# Patient Record
Sex: Female | Born: 1970 | Race: White | Hispanic: No | Marital: Married | Smoking: Never smoker
Health system: Southern US, Community
[De-identification: ages and names within clinical notes are randomized; demographics above are authoritative.]

## PROBLEM LIST (undated history)

## (undated) DIAGNOSIS — G4730 Sleep apnea, unspecified: Secondary | ICD-10-CM

## (undated) DIAGNOSIS — G2581 Restless legs syndrome: Secondary | ICD-10-CM

## (undated) DIAGNOSIS — R112 Nausea with vomiting, unspecified: Secondary | ICD-10-CM

## (undated) DIAGNOSIS — D649 Anemia, unspecified: Secondary | ICD-10-CM

## (undated) DIAGNOSIS — K219 Gastro-esophageal reflux disease without esophagitis: Secondary | ICD-10-CM

## (undated) DIAGNOSIS — T8859XA Other complications of anesthesia, initial encounter: Secondary | ICD-10-CM

## (undated) DIAGNOSIS — Z87442 Personal history of urinary calculi: Secondary | ICD-10-CM

## (undated) DIAGNOSIS — F419 Anxiety disorder, unspecified: Secondary | ICD-10-CM

## (undated) DIAGNOSIS — Z9889 Other specified postprocedural states: Secondary | ICD-10-CM

## (undated) DIAGNOSIS — G43909 Migraine, unspecified, not intractable, without status migrainosus: Secondary | ICD-10-CM

## (undated) DIAGNOSIS — T4145XA Adverse effect of unspecified anesthetic, initial encounter: Secondary | ICD-10-CM

## (undated) DIAGNOSIS — M549 Dorsalgia, unspecified: Secondary | ICD-10-CM

## (undated) HISTORY — DX: Migraine, unspecified, not intractable, without status migrainosus: G43.909

## (undated) HISTORY — DX: Dorsalgia, unspecified: M54.9

## (undated) HISTORY — DX: Restless legs syndrome: G25.81

---
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
## 1998-08-17 ENCOUNTER — Inpatient Hospital Stay (HOSPITAL_COMMUNITY): Admission: AD | Admit: 1998-08-17 | Discharge: 1998-08-17 | Payer: Self-pay | Admitting: Obstetrics and Gynecology

## 1998-11-17 ENCOUNTER — Inpatient Hospital Stay (HOSPITAL_COMMUNITY): Admission: AD | Admit: 1998-11-17 | Discharge: 1998-11-17 | Payer: Self-pay | Admitting: Obstetrics & Gynecology

## 1999-02-05 ENCOUNTER — Inpatient Hospital Stay (HOSPITAL_COMMUNITY): Admission: AD | Admit: 1999-02-05 | Discharge: 1999-02-05 | Payer: Self-pay | Admitting: Obstetrics and Gynecology

## 1999-02-20 ENCOUNTER — Inpatient Hospital Stay (HOSPITAL_COMMUNITY): Admission: AD | Admit: 1999-02-20 | Discharge: 1999-02-23 | Payer: Self-pay | Admitting: *Deleted

## 1999-08-18 ENCOUNTER — Other Ambulatory Visit: Admission: RE | Admit: 1999-08-18 | Discharge: 1999-08-18 | Payer: Self-pay | Admitting: Obstetrics and Gynecology

## 2002-10-21 ENCOUNTER — Inpatient Hospital Stay (HOSPITAL_COMMUNITY): Admission: AD | Admit: 2002-10-21 | Discharge: 2002-10-21 | Payer: Self-pay | Admitting: Obstetrics and Gynecology

## 2002-10-30 ENCOUNTER — Encounter: Payer: Self-pay | Admitting: Obstetrics and Gynecology

## 2002-10-30 ENCOUNTER — Ambulatory Visit (HOSPITAL_COMMUNITY): Admission: RE | Admit: 2002-10-30 | Discharge: 2002-10-30 | Payer: Self-pay | Admitting: Obstetrics and Gynecology

## 2002-11-05 ENCOUNTER — Ambulatory Visit (HOSPITAL_COMMUNITY): Admission: RE | Admit: 2002-11-05 | Discharge: 2002-11-05 | Payer: Self-pay | Admitting: Obstetrics and Gynecology

## 2002-11-05 ENCOUNTER — Encounter: Payer: Self-pay | Admitting: Obstetrics and Gynecology

## 2002-12-03 ENCOUNTER — Encounter: Payer: Self-pay | Admitting: Obstetrics and Gynecology

## 2002-12-03 ENCOUNTER — Ambulatory Visit (HOSPITAL_COMMUNITY): Admission: RE | Admit: 2002-12-03 | Discharge: 2002-12-03 | Payer: Self-pay | Admitting: Obstetrics and Gynecology

## 2003-01-28 ENCOUNTER — Encounter: Payer: Self-pay | Admitting: Obstetrics and Gynecology

## 2003-01-28 ENCOUNTER — Ambulatory Visit (HOSPITAL_COMMUNITY): Admission: RE | Admit: 2003-01-28 | Discharge: 2003-01-28 | Payer: Self-pay | Admitting: Obstetrics and Gynecology

## 2003-03-18 ENCOUNTER — Ambulatory Visit (HOSPITAL_COMMUNITY): Admission: RE | Admit: 2003-03-18 | Discharge: 2003-03-18 | Payer: Self-pay | Admitting: Obstetrics and Gynecology

## 2003-03-18 ENCOUNTER — Encounter: Payer: Self-pay | Admitting: Obstetrics and Gynecology

## 2003-04-08 ENCOUNTER — Encounter: Payer: Self-pay | Admitting: Obstetrics and Gynecology

## 2003-04-08 ENCOUNTER — Ambulatory Visit (HOSPITAL_COMMUNITY): Admission: RE | Admit: 2003-04-08 | Discharge: 2003-04-08 | Payer: Self-pay | Admitting: Obstetrics and Gynecology

## 2003-04-17 ENCOUNTER — Inpatient Hospital Stay (HOSPITAL_COMMUNITY): Admission: AD | Admit: 2003-04-17 | Discharge: 2003-04-17 | Payer: Self-pay | Admitting: Obstetrics and Gynecology

## 2003-07-03 ENCOUNTER — Inpatient Hospital Stay (HOSPITAL_COMMUNITY): Admission: AD | Admit: 2003-07-03 | Discharge: 2003-07-04 | Payer: Self-pay | Admitting: Obstetrics and Gynecology

## 2003-07-03 ENCOUNTER — Encounter: Payer: Self-pay | Admitting: Obstetrics and Gynecology

## 2003-08-18 ENCOUNTER — Other Ambulatory Visit: Admission: RE | Admit: 2003-08-18 | Discharge: 2003-08-18 | Payer: Self-pay | Admitting: Obstetrics and Gynecology

## 2005-01-06 ENCOUNTER — Other Ambulatory Visit: Admission: RE | Admit: 2005-01-06 | Discharge: 2005-01-06 | Payer: Self-pay | Admitting: Obstetrics and Gynecology

## 2006-02-01 ENCOUNTER — Other Ambulatory Visit: Admission: RE | Admit: 2006-02-01 | Discharge: 2006-02-01 | Payer: Self-pay | Admitting: Obstetrics and Gynecology

## 2011-04-28 NOTE — H&P (Signed)
Valerie Johnston, Valerie Johnston                              ACCOUNT NO.:  [id number]   MEDICAL RECORD NO.:  [id number]                   PATIENT TYPE:  INP   LOCATION:  9106                                 FACILITY:  [facility]   PHYSICIAN:  Crist Fat. Rivard, M.D.              DATE OF BIRTH:  1971-09-23   DATE OF ADMISSION:  07/03/2003  DATE OF DISCHARGE:                                HISTORY & PHYSICAL   HISTORY OF PRESENT ILLNESS:  Valerie Johnston is a 40 year old married white female  gravida 2, para 1-0-0-1 at 40-2/7 weeks who presents with regular uterine  contractions during the night. She denies leaking, bleeding, headache,  nausea and vomiting, or visual disturbances. Her pregnancy has been followed  by [facility] and Gynecologic Certified Nurse Midwife  Services and has been remarkable for 1. First trimester bleeding. 2.  Migraines. 3. Renal malformation. 4. Rh negative. 5. Obesity. 6. Group B  strep negative.   PRENATAL LABORATORY DATA:  Were collected on December 02, 2002. Hemoglobin  was 13.8. Hematocrit 41.0. Platelets 207,000. Blood type O negative.  Antibody negative. RPR non-reactive. Rubella immune. Hepatitis B surface  antigen negative. HIV non-reactive. Gonorrhea negative. Chlamydia negative.  Cystic fibrosis negative. On April 01, 2003 her one hour Glucola was 87 and  her hemoglobin at that time was 12.0. Culture of the vaginal tract for group  B strep, gonorrhea and Chlamydia was done on June 01, 2003 and all were  negative.   HISTORY OF PRESENT PREGNANCY:  She presented at [facility]  and Gynecologic Services for care on December 02, 2002 at just under [redacted]  weeks gestation. Pregnancy ultrasonography was done on January 28, 2003 and  showed limited anatomy views of the heart and the cord, which was followed  up on March 18, 2003 and that anatomy was still not viewed, still not seen  adequately. However, growth was in the 75 to 90th percentile at that  time.  On March 19, 2003, the cardiac anatomy was seen well. The rest of her  prenatal care was unremarkable.   OBSTETRIC HISTORY:  She is a gravida 2, para 1-0-0-1. In March of 2000, she  vaginally delivered a female infant weighing 7 pounds and 4 ounces at [redacted] weeks  gestation. She had a 4-5 hour labor with no anesthesia. She was induced due  to post dates. Infant's name was Valerie Johnston. She was on bedrest the last month of  this pregnancy due to increased blood pressure. She received RhoGAM when she  needed it with both pregnancies.   ALLERGIES:  SULFA AND CIPRO.   GYNECOLOGIC HISTORY:  She has used condoms in the past for contraception.  She had atypical cells on a Pap smear in 2000 and they have been within  normal limits ever since. She has had 2-3 yeast infections in her life.   PAST MEDICAL HISTORY:  She  reports having had the usual childhood illnesses.  She has a history of urinary tract infections. History of migraines. 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   PAST SURGICAL HISTORY:  Remarkable for wisdom teeth extraction in 1998.   FAMILY HISTORY:  Maternal grandfather with COPD. Maternal grandmother with  hyperthyroidism and multiple family members who are smokers.   GENETIC HISTORY:  Unremarkable.   SOCIAL HISTORY:  She is married to the father of the baby whose name is  Valerie Johnston. He is involved and supportive. They are of the [place] faith. The  patient is employed as a Clinical biochemist. Father of the baby has a high school education  and is employed full-time. They deny any alcohol, tobacco, or illicit drug  use with the pregnancy.   PHYSICAL EXAMINATION:  VITAL SIGNS: Stable. She is afebrile.  HEENT: Grossly within normal limits.  CHEST: Clear to auscultation.  HEART: Regular rate and rhythm.  ABDOMEN: Gravid in contour. Fundal height extending approximately 40 cm  above the pubic synthesis. Fetal heart rate reactive and reassuring. Uterine   contractions every 3-5 minutes. Moderate to strong.  PELVIC: Cervical examination is 6 cm, 100% effaced, vertex, minus 1.  EXTREMITIES: Within normal limits.   ASSESSMENT:  1. Intrauterine pregnancy at term.  2. Active labor.   PLAN:  1. Admit to birthing suite to service of Dr. Estanislado Pandy.  2. Routine Certified Nurse Midwife orders.  3. Anticipate normal spontaneous vaginal birth.     Cam Hai, C.N.M.                     Crist Fat Rivard, M.D.    KS/MEDQ  D:  07/03/2003  T:  07/03/2003  Job:  [id number]

## 2013-02-27 ENCOUNTER — Ambulatory Visit
Admission: RE | Admit: 2013-02-27 | Discharge: 2013-02-27 | Disposition: A | Discharge status: Home / Self Care | Payer: PRIVATE HEALTH INSURANCE | Source: Ambulatory Visit | Attending: Family Medicine | Admitting: Family Medicine

## 2013-02-27 ENCOUNTER — Other Ambulatory Visit: Payer: Self-pay | Admitting: Family Medicine

## 2013-02-27 DIAGNOSIS — R0609 Other forms of dyspnea: Secondary | ICD-10-CM

## 2013-02-27 DIAGNOSIS — R609 Edema, unspecified: Secondary | ICD-10-CM

## 2013-02-27 MED ORDER — IOHEXOL 350 MG/ML SOLN
100.0000 mL | Freq: Once | INTRAVENOUS | Status: AC | PRN
  Administered 2013-02-27: 100 mL via INTRAVENOUS

## 2013-08-27 IMAGING — CT CT ANGIO CHEST
2 of 6 series · 18 of 36 positions shown · IV contrast ([ID] OMNI 350)
Comparison: None.

CLINICAL DATA: Dyspnea

CT ANGIOGRAPHY CHEST
TECHNIQUE: Multidetector CT imaging of the chest using the
standard protocol during bolus administration of intravenous
contrast. Multiplanar reconstructed images including MIPs were
obtained and reviewed to evaluate the vascular anatomy.
Contrast: 100mL OMNIPAQUE IOHEXOL 350 MG/ML SOLN

[Series 4: pe 1.25 · axial · 0.70mm/px · z∈[-266,-18]mm · 17 of 224 slices shown]
[im 13/224  lung]
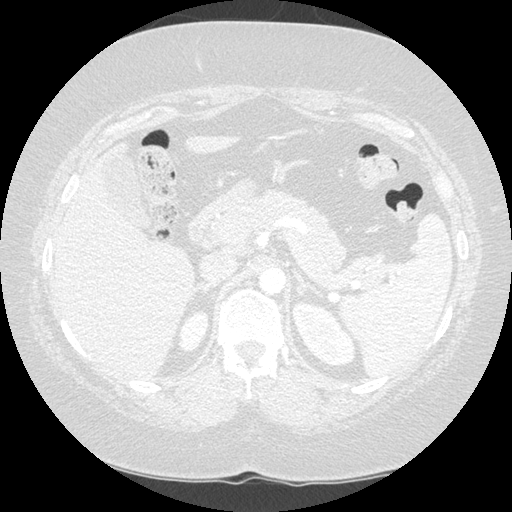
[im 25/224  mediastinal]
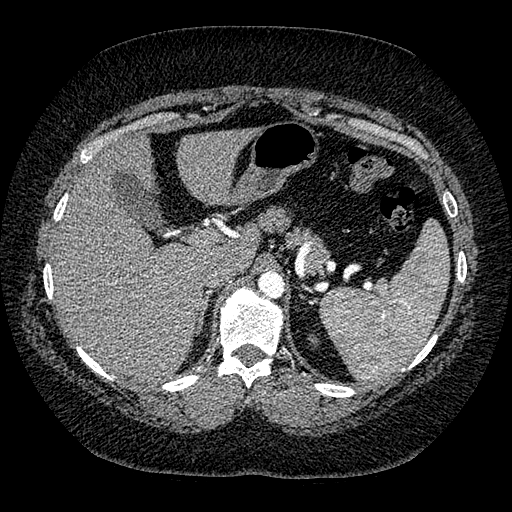
[im 38/224  lung]
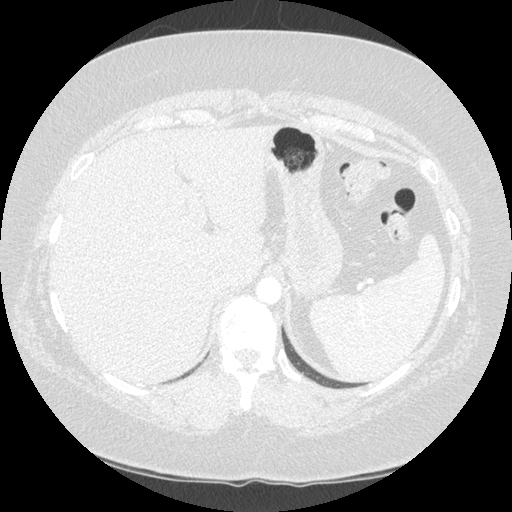
[im 50/224  mediastinal]
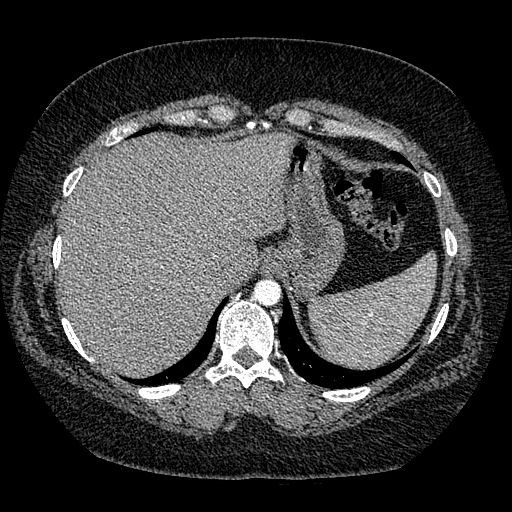
[im 62/224  lung]
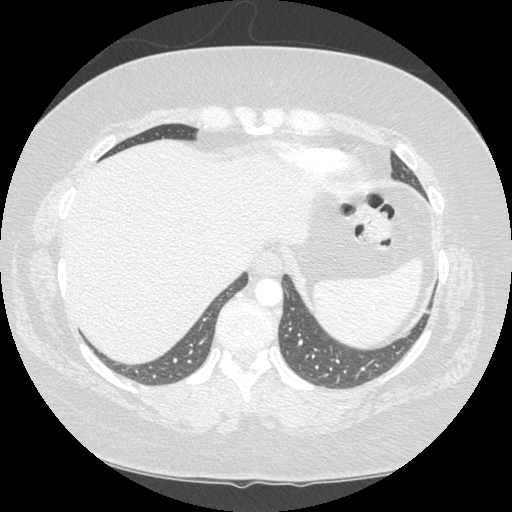
[im 75/224  mediastinal]
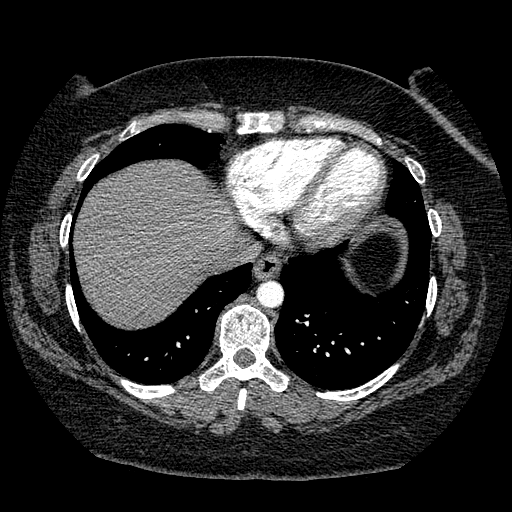
[im 87/224  lung]
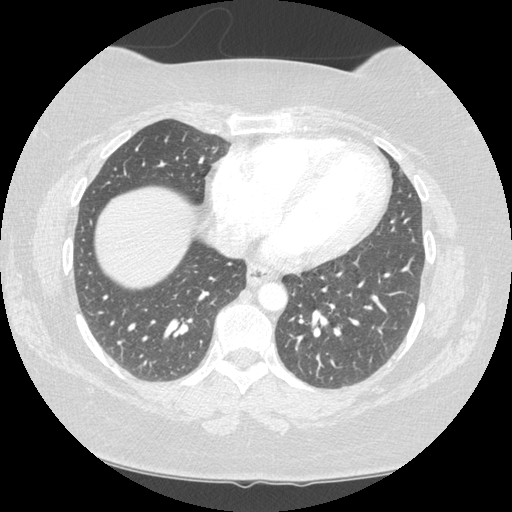
[im 100/224  mediastinal]
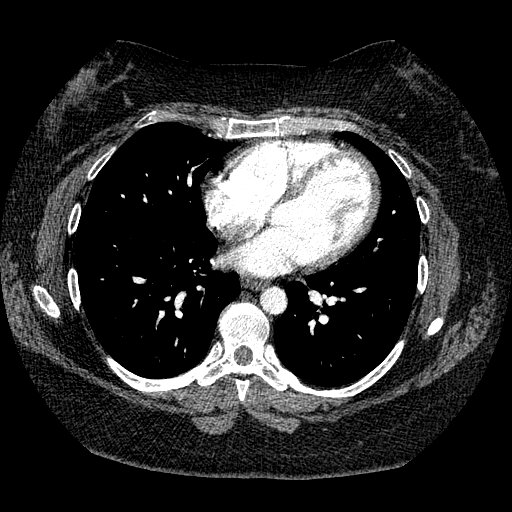
[im 112/224  lung]
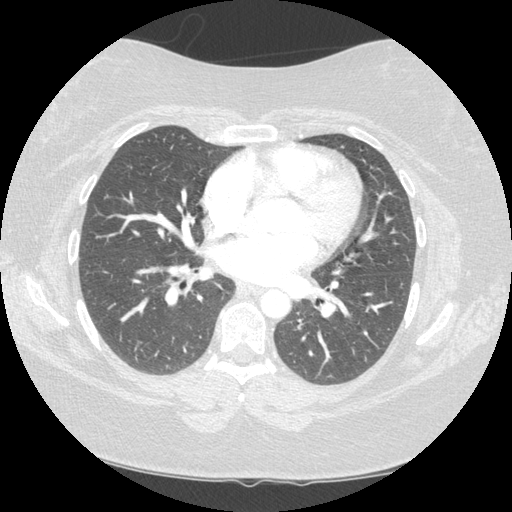
[im 124/224  mediastinal]
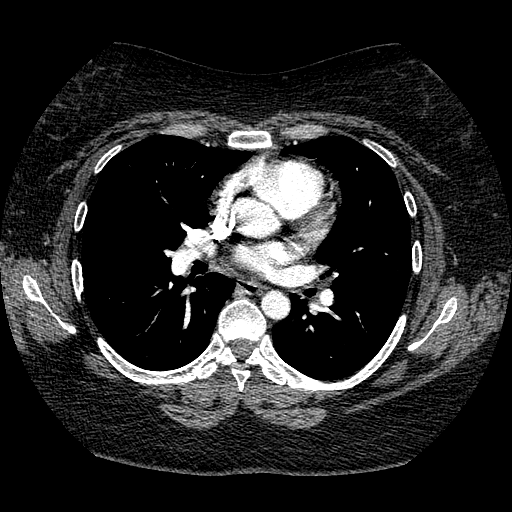
[im 137/224  lung]
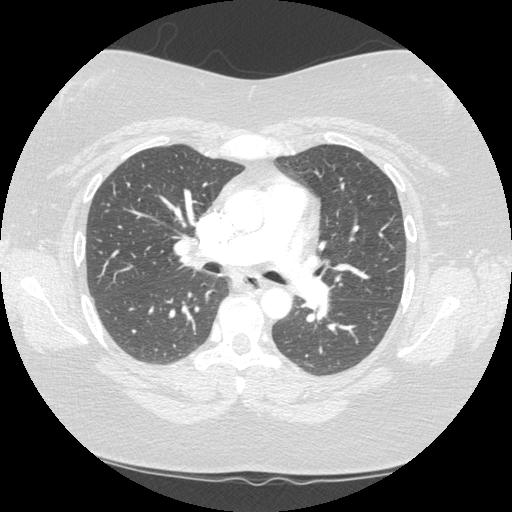
[im 149/224  mediastinal]
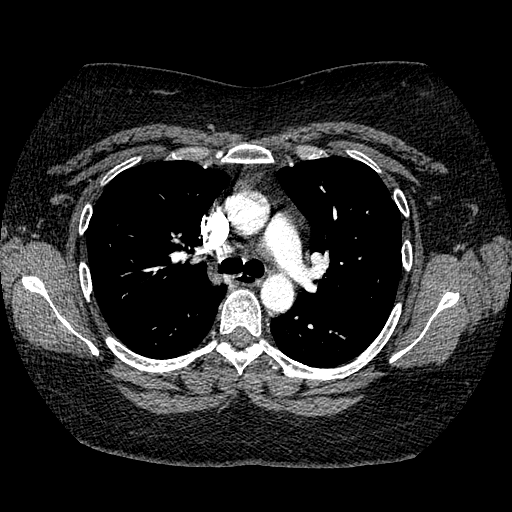
[im 162/224  lung]
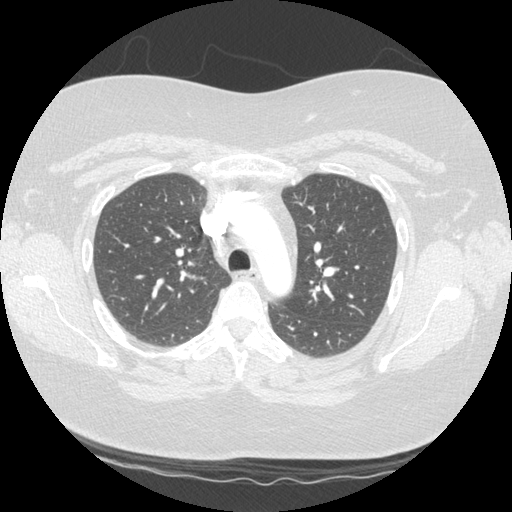
[im 174/224  mediastinal]
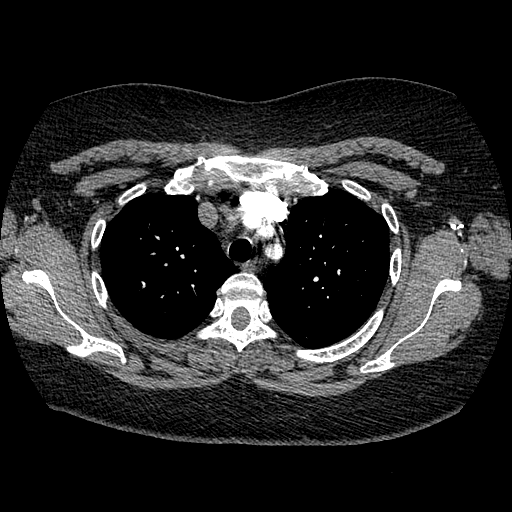
[im 186/224  lung]
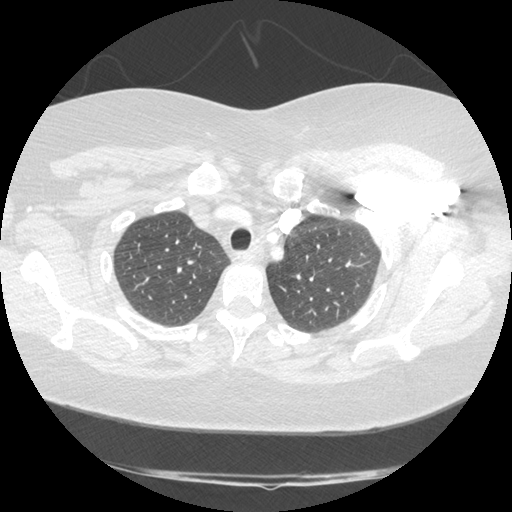
[im 199/224  mediastinal]
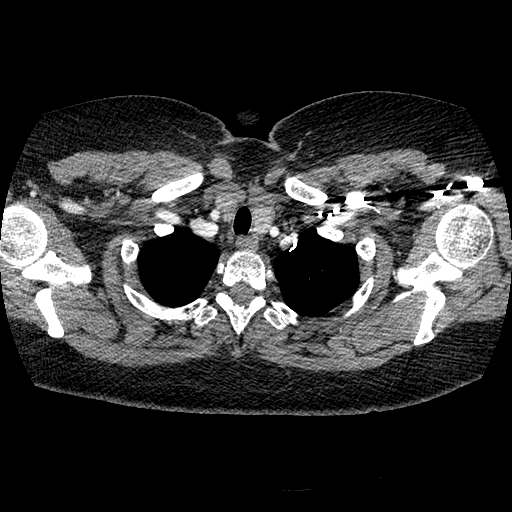
[im 211/224  lung]
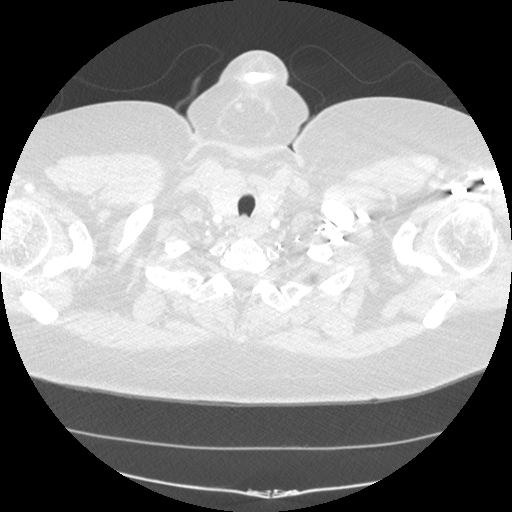

[Series 106: cor · coronal · 0.70mm/px · 1 of 125 slices shown]
[im 63/125  mediastinal]
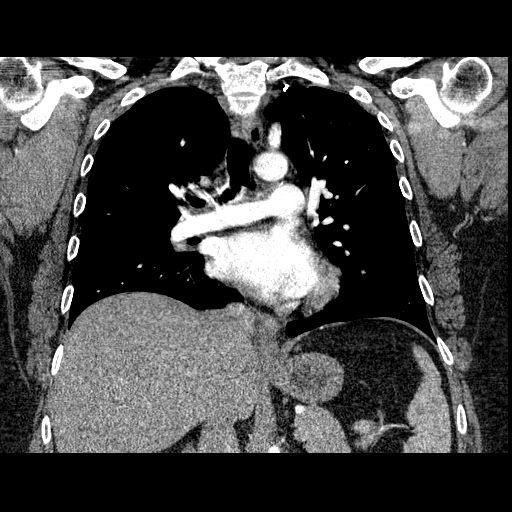

[18 of 36 positions shown; findings below may reference images not displayed]

FINDINGS: There are no filling defects in the pulmonary arterial
tree to suggest acute pulmonary thromboembolism.

No evidence of aortic dissection, transection, or aneurysm.

No pericardial effusion.  No abnormal mediastinal adenopathy.

No pneumothorax or pleural effusion.

4 mm right middle lobe irregular nodule on image 71.  Second 4 mm
nodule in the right middle lobe on image 61. 4 mm right lower lobe
pulmonary nodule on image 65

No destructive bone lesion.  Stable sclerotic foci within the T11
vertebral body.
IMPRESSION: No evidence of acute pulmonary thromboembolism.

Multiple right-sided pulmonary nodules measuring 4 mm. If the
patient is at high risk for bronchogenic carcinoma, follow-up chest
CT at 1 year is recommended.  If the patient is at low risk, no
follow-up is needed.  This recommendation follows the consensus
statement: Guidelines for Management of Small Pulmonary Nodules
Detected on CT Scans:  A Statement from the [HOSPITAL] as

## 2018-06-14 ENCOUNTER — Telehealth: Payer: Self-pay | Admitting: Family

## 2018-06-14 NOTE — Telephone Encounter (Signed)
Opened in error

## 2020-03-10 NOTE — H&P (Signed)
50 year old female with menorrhagia   Medical and surgical history unremarkable  .allergies NKDA  VSS General alert and oriented Lung CTAB Car RRR  Abdomen is soft and non tender Pelvis WNL  IMPRESSION: Menorrhagia  PLAN: D and C, Hysteroscopy and HTA Risks reviewed  Consent signed

## 2020-03-11 ENCOUNTER — Other Ambulatory Visit: Payer: Self-pay

## 2020-03-11 ENCOUNTER — Encounter (HOSPITAL_BASED_OUTPATIENT_CLINIC_OR_DEPARTMENT_OTHER): Payer: Self-pay | Admitting: Obstetrics and Gynecology

## 2020-03-11 NOTE — Progress Notes (Signed)
Spoke w/ via phone for pre-op interview---[facility] needs dos----  Cbc, type and screen, urine poct            COVID test ------03-16-2020 250 pm Arrive at -------530 am 03-19-2020 NPO after ------midnight Medications to take morning of surgery -----paxil, prilosec, slynz  birth control pill Diabetic medication -----n/a Patient Special Instructions -----bring cpap mask tubing and machine and leave in car Pre-Op special Istructions ----- Patient verbalized understanding of instructions that were given at this phone interview. Patient denies shortness of breath, chest pain, fever, cough a this phone interview.

## 2020-03-16 ENCOUNTER — Other Ambulatory Visit (HOSPITAL_COMMUNITY)
Admission: RE | Admit: 2020-03-16 | Discharge: 2020-03-16 | Disposition: A | Discharge status: Home / Self Care | Payer: BC Managed Care – PPO | Source: Ambulatory Visit | Attending: Obstetrics and Gynecology | Admitting: Obstetrics and Gynecology

## 2020-03-16 DIAGNOSIS — Z01812 Encounter for preprocedural laboratory examination: Secondary | ICD-10-CM | POA: Diagnosis present

## 2020-03-16 DIAGNOSIS — Z20822 Contact with and (suspected) exposure to covid-19: Secondary | ICD-10-CM | POA: Insufficient documentation

## 2020-03-16 LAB — SARS CORONAVIRUS 2 (TAT 6-24 HRS): SARS Coronavirus 2: NEGATIVE

## 2020-05-27 ENCOUNTER — Ambulatory Visit (HOSPITAL_BASED_OUTPATIENT_CLINIC_OR_DEPARTMENT_OTHER)
Admission: RE | Admit: 2020-05-27 | Payer: BC Managed Care – PPO | Source: Home / Self Care | Admitting: Obstetrics and Gynecology

## 2020-05-27 HISTORY — DX: Anxiety disorder, unspecified: F41.9

## 2020-05-27 HISTORY — DX: Personal history of urinary calculi: Z87.442

## 2020-05-27 HISTORY — DX: Other specified postprocedural states: Z98.890

## 2020-05-27 HISTORY — DX: Other complications of anesthesia, initial encounter: T88.59XA

## 2020-05-27 HISTORY — DX: Gastro-esophageal reflux disease without esophagitis: K21.9

## 2020-05-27 HISTORY — DX: Restless legs syndrome: G25.81

## 2020-05-27 HISTORY — DX: Anemia, unspecified: D64.9

## 2020-05-27 HISTORY — DX: Nausea with vomiting, unspecified: R11.2

## 2020-05-27 HISTORY — DX: Sleep apnea, unspecified: G47.30

## 2020-05-27 SURGERY — DILATATION & CURETTAGE/HYSTEROSCOPY WITH HYDROTHERMAL ABLATION
Anesthesia: Choice

## 2020-08-19 ENCOUNTER — Encounter (INDEPENDENT_AMBULATORY_CARE_PROVIDER_SITE_OTHER): Payer: Self-pay | Admitting: Family Medicine

## 2020-08-19 ENCOUNTER — Ambulatory Visit (INDEPENDENT_AMBULATORY_CARE_PROVIDER_SITE_OTHER): Payer: BC Managed Care – PPO | Admitting: Family Medicine

## 2020-08-19 ENCOUNTER — Other Ambulatory Visit: Payer: Self-pay

## 2020-08-19 VITALS — BP 126/85 | HR 70 | Temp 98.6°F | Ht 67.0 in | Wt 298.0 lb

## 2020-08-19 DIAGNOSIS — G4733 Obstructive sleep apnea (adult) (pediatric): Secondary | ICD-10-CM

## 2020-08-19 DIAGNOSIS — G43809 Other migraine, not intractable, without status migrainosus: Secondary | ICD-10-CM

## 2020-08-19 DIAGNOSIS — D508 Other iron deficiency anemias: Secondary | ICD-10-CM | POA: Diagnosis not present

## 2020-08-19 DIAGNOSIS — Z1331 Encounter for screening for depression: Secondary | ICD-10-CM

## 2020-08-19 DIAGNOSIS — G43909 Migraine, unspecified, not intractable, without status migrainosus: Secondary | ICD-10-CM | POA: Insufficient documentation

## 2020-08-19 DIAGNOSIS — F4322 Adjustment disorder with anxiety: Secondary | ICD-10-CM | POA: Insufficient documentation

## 2020-08-19 DIAGNOSIS — R5383 Other fatigue: Secondary | ICD-10-CM | POA: Diagnosis not present

## 2020-08-19 DIAGNOSIS — R0602 Shortness of breath: Secondary | ICD-10-CM

## 2020-08-19 DIAGNOSIS — Z9989 Dependence on other enabling machines and devices: Secondary | ICD-10-CM

## 2020-08-19 DIAGNOSIS — Z6841 Body Mass Index (BMI) 40.0 and over, adult: Secondary | ICD-10-CM

## 2020-08-19 DIAGNOSIS — Z0289 Encounter for other administrative examinations: Secondary | ICD-10-CM

## 2020-08-19 DIAGNOSIS — D649 Anemia, unspecified: Secondary | ICD-10-CM | POA: Insufficient documentation

## 2020-08-19 DIAGNOSIS — F4320 Adjustment disorder, unspecified: Secondary | ICD-10-CM

## 2020-08-19 DIAGNOSIS — Z9189 Other specified personal risk factors, not elsewhere classified: Secondary | ICD-10-CM | POA: Diagnosis not present

## 2020-08-19 NOTE — Progress Notes (Unsigned)
Office: [phone]  /  Fax: [phone]    Date: August 24, 2020   Appointment Start Time: *** Duration: *** minutes Provider: Glennie Isle, Psy.D. Type of Session: Intake for Individual Therapy  Location of Patient: {gbptloc:23249} Location of Provider: Provider's Home Type of Contact: Telepsychological Visit via MyChart Video Visit  Informed Consent: Prior to proceeding with today's appointment, two pieces of identifying information were obtained. In addition, Arnisha's physical location at the time of this appointment was obtained as well a phone number she could be reached at in the event of technical difficulties. Idalis and this provider participated in today's telepsychological service.   The provider's role was explained to Abena Zacarias. The provider reviewed and discussed issues of confidentiality, privacy, and limits therein (e.g., reporting obligations). In addition to verbal informed consent, written informed consent for psychological services was obtained prior to the initial appointment. Since the clinic is not a 24/7 crisis center, mental health emergency resources were shared and this  provider explained MyChart, e-mail, voicemail, and/or other messaging systems should be utilized only for non-emergency reasons. This provider also explained that information obtained during appointments will be placed in Lauranne's medical record and relevant information will be shared with other providers at [facility] for coordination of care. Moreover, Braelynn agreed information Abbett be shared with other [facility] providers as needed for coordination of care. By signing the service agreement document, Rachna provided written consent for coordination of care. Prior to initiating telepsychological services, Manisha completed an informed consent document, which included the development of a safety plan (i.e., an emergency contact, nearest emergency room, and emergency  resources) in the event of an emergency/crisis. Cashlyn expressed understanding of the rationale of the safety plan. Chassidy verbally acknowledged understanding she is ultimately responsible for understanding her insurance benefits for telepsychological and in-person services. This provider also reviewed confidentiality, as it relates to telepsychological services, as well as the rationale for telepsychological services (i.e., to reduce exposure risk to COVID-19). September  acknowledged understanding that appointments cannot be recorded without both party consent and she is aware she is responsible for securing confidentiality on her end of the session. Devin verbally consented to proceed.  Chief Complaint/HPI: Patrica was referred by Dr. Mellody Dance due to Adjustment Disorder, With Emotional Eating. Per the note for the initial visit with Dr. Mellody Dance on August 19, 2020, "She has been on Paxil 25 mg for 2 years now.  She feels her anxiety is well-controlled.  She has been to counseling in the past, but none in years." The note for the initial appointment with Dr. Neoma Laming Opalskiindicated the following: "Iyana's habits were reviewed today and are as follows: Her family eats meals together, her desired weight loss is 100 pounds, she has been heavy most of her life, she started gaining weight after pregnancy, her heaviest weight ever was her current weight, she is a picky eater and doesn't like to eat healthier foods, she craves bread, potatoes, and sweets, she skips breakfast and/or lunch frequently, she is frequently drinking liquids with calories, she frequently makes poor food choices, she frequently eats larger portions than normal and she struggles with emotional eating." Shyasia's Food and Mood (modified PHQ-9) score on August 19, 2020 was 1.  During today's appointment, Aithana was verbally administered a questionnaire assessing various behaviors related to emotional eating. Jaelyne endorsed the  following: {gbmoodandfood:21755}. She shared she craves ***. Oluwaseun believes the onset of emotional eating was *** and described the current frequency  of emotional eating as ***. In addition, Emine {gblegal:22371} a history of binge eating. *** Moreover, Tahiry indicated *** triggers emotional eating, whereas *** makes emotional eating better. Furthermore, Suzanna {gblegal:22371} other problems of concern. ***   Mental Status Examination:  Appearance: {Appearance:22431} Behavior: {Behavior:22445} Mood: {gbmood:21757} Affect: {Affect:22436} Speech: {Speech:22432} Eye Contact: {Eye Contact:22433} Psychomotor Activity: {Motor Activity:22434} Gait: {gbgait:23404} Thought Process: {thought process:22448}  Thought Content/Perception: {disturbances:22451} Orientation: {Orientation:22437} Memory/Concentration: {gbcognition:22449} Insight/Judgment: {Insight:22446}  Family & Psychosocial History: Krystina reported she is *** and ***. She indicated she is currently ***. Additionally, Jamyia shared her highest level of education obtained is ***. Currently, Dorea's social support system consists of her ***. Moreover, Braiden stated she resides with her ***.   Medical History: ***  Mental Health History: Shylin reported ***. Nomi {Endorse or deny of item:23407} hospitalizations for psychiatric concerns, and she has never met with a psychiatrist.*** Brylea stated she was *** psychotropic medications. Patrycja {gblegal:22371} a family history of mental health related concerns. *** Adele {Endorse or deny of item:23407} trauma including {gbtrauma:22071} abuse, as well as neglect. ***  Leira described her typical mood lately as ***. Aside from concerns noted above and endorsed on the PHQ-9 and GAD-7, Anjeanette reported ***. Chelly {gblegal:22371} current alcohol use. *** She {gblegal:22371} tobacco use. *** She {WIOXBDZ:32992} illicit/recreational substance use. Regarding caffeine intake, Simrah reported ***.  Furthermore, Indica indicated she is not experiencing the following: {gbsxs:21965}. She also denied history of and current suicidal ideation, plan, and intent; history of and current homicidal ideation, plan, and intent; and history of and current engagement in self-harm.  The following strengths were reported by Pierce: ***. The following strengths were observed by this provider: ability to express thoughts and feelings during the therapeutic session, ability to establish and benefit from a therapeutic relationship, willingness to work toward established goal(s) with the clinic and ability to engage in reciprocal conversation. ***  Legal History: Radie {Endorse or deny of item:23407} legal involvement.   Structured Assessments Results: The Patient Health Questionnaire-9 (PHQ-9) is a self-report measure that assesses symptoms and severity of depression over the course of the last two weeks. Tishara obtained a score of *** suggesting {GBPHQ9SEVERITY:21752}. Javia finds the endorsed symptoms to be {gbphq9difficulty:21754}. [0= Not at all; 1= Several days; 2= More than half the days; 3= Nearly every day] Little interest or pleasure in doing things ***  Feeling down, depressed, or hopeless ***  Trouble falling or staying asleep, or sleeping too much ***  Feeling tired or having little energy ***  Poor appetite or overeating ***  Feeling bad about yourself --- or that you are a failure or have let yourself or your family down ***  Trouble concentrating on things, such as reading the newspaper or watching television ***  Moving or speaking so slowly that other people could have noticed? Or the opposite --- being so fidgety or restless that you have been moving around a lot more than usual ***  Thoughts that you would be better off dead or hurting yourself in some way ***  PHQ-9 Score ***    The Generalized Anxiety Disorder-7 (GAD-7) is a brief self-report measure that assesses symptoms of anxiety over  the course of the last two weeks. Lynette obtained a score of *** suggesting {gbgad7severity:21753}. Lonya finds the endorsed symptoms to be {gbphq9difficulty:21754}. [0= Not at all; 1= Several days; 2= Over half the days; 3= Nearly every day] Feeling nervous, anxious, on edge ***  Not being able to stop or control worrying ***  Worrying too much about different things ***  Trouble relaxing ***  Being so restless that it's hard to sit still ***  Becoming easily annoyed or irritable ***  Feeling afraid as if something awful might happen ***  GAD-7 Score ***   Interventions:  {Interventions List for Intake:23406}  Provisional DSM-5 Diagnosis(es): {Diagnoses:22752}  Plan: Lalita appears able and willing to participate as evidenced by collaboration on a treatment goal, engagement in reciprocal conversation, and asking questions as needed for clarification. The next appointment will be scheduled in {gbweeks:21758}, which will be {gbtxmodality:23402}. The following treatment goal was established: {gbtxgoals:21759}. This provider will regularly review the treatment plan and medical chart to keep informed of status changes. Kristopher expressed understanding and agreement with the initial treatment plan of care. *** Emonnie will be sent a handout via e-mail to utilize between now and the next appointment to increase awareness of hunger patterns and subsequent eating. Klynn provided verbal consent during today's appointment for this provider to send the handout via e-mail. ***

## 2020-08-20 LAB — COMPREHENSIVE METABOLIC PANEL
ALT: 15 IU/L (ref 0–32)
AST: 16 IU/L (ref 0–40)
Albumin/Globulin Ratio: 1.4 (ref 1.2–2.2)
Albumin: 4.1 g/dL (ref 3.8–4.8)
Alkaline Phosphatase: 105 IU/L (ref 48–121)
BUN/Creatinine Ratio: 12 (ref 9–23)
BUN: 9 mg/dL (ref 6–24)
Bilirubin Total: 0.3 mg/dL (ref 0.0–1.2)
CO2: 22 mmol/L (ref 20–29)
Calcium: 9.1 mg/dL (ref 8.7–10.2)
Chloride: 103 mmol/L (ref 96–106)
Creatinine, Ser: 0.76 mg/dL (ref 0.57–1.00)
GFR calc Af Amer: 108 mL/min/{1.73_m2} (ref >59)
GFR calc non Af Amer: 94 mL/min/{1.73_m2} (ref >59)
Globulin, Total: 2.9 g/dL (ref 1.5–4.5)
Glucose: 85 mg/dL (ref 65–99)
Potassium: 4.3 mmol/L (ref 3.5–5.2)
Sodium: 139 mmol/L (ref 134–144)
Total Protein: 7 g/dL (ref 6.0–8.5)

## 2020-08-20 LAB — FOLATE: Folate: 6.3 ng/mL (ref >3.0)

## 2020-08-20 LAB — HEMOGLOBIN A1C
Est. average glucose Bld gHb Est-mCnc: 114 mg/dL
Hgb A1c MFr Bld: 5.6 % (ref 4.8–5.6)

## 2020-08-20 LAB — TSH: TSH: 5.51 u[IU]/mL — ABNORMAL HIGH (ref 0.450–4.500)

## 2020-08-20 LAB — LIPID PANEL WITH LDL/HDL RATIO
Cholesterol, Total: 179 mg/dL (ref 100–199)
HDL: 44 mg/dL (ref >39)
LDL Chol Calc (NIH): 112 mg/dL — ABNORMAL HIGH (ref 0–99)
LDL/HDL Ratio: 2.5 ratio (ref 0.0–3.2)
Triglycerides: 129 mg/dL (ref 0–149)
VLDL Cholesterol Cal: 23 mg/dL (ref 5–40)

## 2020-08-20 LAB — INSULIN, RANDOM: INSULIN: 10.5 u[IU]/mL (ref 2.6–24.9)

## 2020-08-20 LAB — VITAMIN D 25 HYDROXY (VIT D DEFICIENCY, FRACTURES): Vit D, 25-Hydroxy: 26.5 ng/mL — ABNORMAL LOW (ref 30.0–100.0)

## 2020-08-20 LAB — T3: T3, Total: 142 ng/dL (ref 71–180)

## 2020-08-20 LAB — T4, FREE: Free T4: 0.97 ng/dL (ref 0.82–1.77)

## 2020-08-20 LAB — VITAMIN B12: Vitamin B-12: 428 pg/mL (ref 232–1245)

## 2020-08-22 NOTE — Progress Notes (Signed)
Dear Dr. Manus Gunning,   Thank you for referring Valerie Johnston to our clinic. The following note includes my evaluation and treatment recommendations.  Chief Complaint:   OBESITY Valerie Johnston (MR# [id number]) is a 49 y.o. female who presents for evaluation and treatment of obesity and related comorbidities. Current BMI is Body mass index is 46.67 kg/m. [name] has been struggling with her weight for many years and has been unsuccessful in either losing weight, maintaining weight loss, or reaching her healthy weight goal.  [name] is currently in the action stage of change and ready to dedicate time achieving and maintaining a healthier weight. Modest is interested in becoming our patient and working on intensive lifestyle modifications including (but not limited to) diet and exercise for weight loss.  [name]'s habits were reviewed today and are as follows: Her family eats meals together, her desired weight loss is 100 pounds, she has been heavy most of her life, she started gaining weight after pregnancy, her heaviest weight ever was her current weight, she is a picky eater and doesn't like to eat healthier foods, she craves bread, potatoes, and sweets, she skips breakfast and/or lunch frequently, she is frequently drinking liquids with calories, she frequently makes poor food choices, she frequently eats larger portions than normal and she struggles with emotional eating.  Depression Screen Ashyia's Food and Mood (modified PHQ-9) score was 14.  Depression screen [facility] 2/9 08/19/2020  Decreased Interest 0  Down, Depressed, Hopeless 1  PHQ - 2 Score 1  Altered sleeping 0  Tired, decreased energy 0  Change in appetite 0  Feeling bad or failure about yourself  0  Trouble concentrating 0  Moving slowly or fidgety/restless 0  Suicidal thoughts 0  PHQ-9 Score 1   Subjective:   1. Other fatigue Valerie Johnston denies daytime somnolence and denies waking up still tired. Patent has a history of symptoms of  snoring. Valerie Johnston generally gets 7 or 8 hours of sleep per night, and states that she has generally restful sleep. Snoring is present. Apneic episodes are not present. Epworth Sleepiness Score is 1.  Valerie Johnston has OSA and uses a CPAP machine.  2. Shortness of breath on exertion [name] notes increasing shortness of breath with exercising and seems to be worsening over time with weight gain. She notes getting out of breath sooner with activity than she used to. This has gotten worse recently. [name] denies shortness of breath at rest or orthopnea.  3. Other iron deficiency anemia Valerie Johnston is not a vegetarian.  She does not have a history of weight loss surgery.  She endorses fatigue, and is now being monitored by her PCP.  She is not on infusions or an iron supplement at all now.  She has a history of having an iron infusion about 1 year ago.  On 07/28/2020, CBC showed H&H of 13.0/37.7, WBC 6.9, Fe 26, ferritin 7.2, PH 305.  Lab Results  Component Value Date   VITAMINB12 428 08/19/2020   4. Other migraine without status migrainosus, not intractable Symptoms are currently stable.  She takes OTC medications as needed.   5. OSA on CPAP Valerie Johnston has a diagnosis of sleep apnea. She reports that she is using a CPAP regularly.  Epworth score is 1 today.  6. Adjustment disorder, with emotional eating She has been on Paxil 25 mg for 2 years now.  She feels her anxiety is well-controlled.  She has been to counseling in the past, but none in years.  7. Depression  screening Valerie Johnston was screened for depression as part of her new patient workup today.  This is the patient's first visit at [facility].  The patient's NEW PATIENT PACKET that they filled out prior to today's office visit was reviewed at length and some information from that paperwork was also included within the following office visit note.    Included in the packet: current and past health history, medications, allergies, ROS,  gynecologic history (women only), surgical history, family history, social history, weight history, weight loss surgery history (for those that have had weight loss surgery), nutritional evaluation, mood and food questionnaire along with a depression screening (PHQ9) on all patients, an Epworth questionnaire, sleep habits questionnaire, patient life and health improvement goals questionnaire. These will all be scanned into the patient's chart under media.   During the visit, I independently reviewed the patient's EKG, bioimpedance scale results, and indirect calorimeter results. I used this information to tailor a meal plan for the patient that will help [name] to lose weight and will improve her obesity-related conditions going forward. I performed a medically necessary appropriate examination and/or evaluation. I discussed the assessment and treatment plan with the patient. The patient was provided an opportunity to ask questions and all were answered. The patient agreed with the plan and demonstrated an understanding of the instructions. Labs were ordered at this visit and will be reviewed at the next visit unless more critical results need to be addressed immediately. Clinical information was updated and documented in the EMR.   Time spent on visit including pre-visit chart review and post-visit care was estimated to be 60-74 minutes.  A separate 15 minutes was spent on risk counseling (see above/below).   8. At risk for impaired metabolic function Due to Valerie Johnston's current state of health and medical condition(s), they are at a significantly higher risk for impaired metabolic function.   This further also puts the patient at much greater risk to also subsequently develop cardiopulmonary conditions that can negatively affect patient's quality of life as well.  At least 8 minutes was spent on counseling Valerie Johnston about these concerns today and I stressed the importance of reversing these risks factors.    Initial goal is to lose at least 5-10% of starting weight to help reduce risk factors.   Counseling: Intensive lifestyle modifications discussed with [name] as most appropriate first line treatment.  she will continue to work on diet, exercise and weight loss efforts.  We will continue to reassess these conditions on a fairly regular basis in an attempt to decrease patient's overall morbidity and mortality  Assessment/Plan:   1. Other fatigue Ben does feel that her weight is causing her energy to be lower than it should be. Fatigue Cheong be related to obesity, depression or many other causes. Labs will be ordered, and in the meanwhile, Livana will focus on self care including making healthy food choices, increasing physical activity and focusing on stress reduction.  Check labs, IC, ECG, which I reviewed with her today and appears within normal limits.  - EKG 12-Lead - Vitamin B12 - Comprehensive metabolic panel - Lipid Panel With LDL/HDL Ratio - T3 - T4, free - TSH - VITAMIN D 25 Hydroxy (Vit-D Deficiency, Fractures) - Hemoglobin A1c - Insulin, random - Folate  2. Shortness of breath on exertion Olene does feel that she gets out of breath more easily that she used to when she exercises. Haidyn's shortness of breath appears to be obesity related and exercise induced. She  has agreed to work on weight loss and gradually increase exercise to treat her exercise induced shortness of breath. Will continue to monitor closely.  Check labs, IC, ECG.  - Vitamin B12 - Comprehensive metabolic panel - Lipid Panel With LDL/HDL Ratio - VITAMIN D 25 Hydroxy (Vit-D Deficiency, Fractures) - Folate  3. Other iron deficiency anemia Since CBC and iron studies recently done, no need for recheck.  Continue monitoring with PCP.  Orders and follow up as documented in patient record.  Counseling . Iron is essential for our bodies to make red blood cells.  Reasons that someone Colvin be deficient include: an  iron-deficient diet (more likely in those following vegan or vegetarian diets), women with heavy menses, patients with GI disorders or poor absorption, patients that have had bariatric surgery, frequent blood donors, patients with cancer, and patients with heart disease.   Gaspar Cola foods include dark leafy greens, red and white meats, eggs, seafood, and beans.   . Certain foods and drinks prevent your body from absorbing iron properly. Avoid eating these foods in the same meal as iron-rich foods or with iron supplements. These foods include: coffee, black tea, and red wine; milk, dairy products, and foods that are high in calcium; beans and soybeans; whole grains.  . Constipation can be a side effect of iron supplementation. Increased water and fiber intake are helpful. Water goal: > 2 liters/day. Fiber goal: > 25 grams/day.  - Vitamin B12 - Folate  4. Other migraine without status migrainosus, not intractable Will check labs today.  - T3 - T4, free - TSH  5. OSA on CPAP Intensive lifestyle modifications are the first line treatment for this issue. We discussed several lifestyle modifications today and she will continue to work on diet, exercise and weight loss efforts. We will continue to monitor. Orders and follow up as documented in patient record.  Symptoms well-controlled.  Continue CPAP at bedtime.  Educated her on the importance.  6. Adjustment disorder, with emotional eating Patient was referred to Dr. Dewaine Conger, our Bariatric Psychologist, for evaluation due to her elevated PHQ-9 score and significant struggles with emotional eating.  Continue medication per PCP, but we discussed possibly weaning Paxil due to it being an obesogenic medication.  - Comprehensive metabolic panel - T3 - T4, free - TSH  7. Depression screening Marca had a positive depression screening. Depression is commonly associated with obesity and often results in emotional eating behaviors. We will monitor this  closely and work on CBT to help improve the non-hunger eating patterns. Referral to Psychology Fruge be required if no improvement is seen as she continues in our clinic.  8. At risk for impaired metabolic function Due to Bryton's current state of health and medical condition(s), they are at a significantly higher risk for impaired metabolic function.   This further also puts the patient at much greater risk to also subsequently develop cardiopulmonary conditions that can negatively affect patient's quality of life as well.  At least 8 minutes was spent on counseling Yolande about these concerns today and I stressed the importance of reversing these risks factors.   Initial goal is to lose at least 5-10% of starting weight to help reduce risk factors.   Counseling: Intensive lifestyle modifications discussed with Simar as most appropriate first line treatment.  she will continue to work on diet, exercise and weight loss efforts.  We will continue to reassess these conditions on a fairly regular basis in an attempt to decrease patient's overall  morbidity and mortality  9. Class 3 severe obesity with serious comorbidity and body mass index (BMI) of 45.0 to 49.9 in adult, unspecified obesity type (HCC) Gearldine BienenstockBrandy is currently in the action stage of change and her goal is to continue with weight loss efforts. I recommend Gearldine BienenstockBrandy begin the structured treatment plan as follows:  She has agreed to the Category 3 Plan.  Exercise goals: As is.   Behavioral modification strategies: meal planning and cooking strategies and planning for success.  She was informed of the importance of frequent follow-up visits to maximize her success with intensive lifestyle modifications for her multiple health conditions. She was informed we would discuss her lab results at her next visit unless there is a critical issue that needs to be addressed sooner. Meklit agreed to keep her next visit at the agreed upon time to discuss these  results.  Objective:   Blood pressure 126/85, pulse 70, temperature 98.6 F (37 C), height 5\' 7"  (1.702 m), weight 298 lb (135.2 kg), SpO2 99 %. Body mass index is 46.67 kg/m.  EKG: Normal sinus rhythm, rate 70 bpm.  Indirect Calorimeter completed today shows a VO2 of 358 and a REE of 2495.  Her calculated basal metabolic rate is [id number] thus her basal metabolic rate is worse than expected.  General: Cooperative, alert, well developed, in no acute distress. HEENT: Conjunctivae and lids unremarkable. Cardiovascular: Regular rhythm.  Lungs: Normal work of breathing. Neurologic: No focal deficits.   Lab Results  Component Value Date   CREATININE 0.76 08/19/2020   BUN 9 08/19/2020   NA 139 08/19/2020   K 4.3 08/19/2020   CL 103 08/19/2020   CO2 22 08/19/2020   Lab Results  Component Value Date   ALT 15 08/19/2020   AST 16 08/19/2020   ALKPHOS 105 08/19/2020   BILITOT 0.3 08/19/2020   Lab Results  Component Value Date   HGBA1C 5.6 08/19/2020   Lab Results  Component Value Date   INSULIN 10.5 08/19/2020   Lab Results  Component Value Date   TSH 5.510 (H) 08/19/2020   Lab Results  Component Value Date   CHOL 179 08/19/2020   HDL 44 08/19/2020   LDLCALC 112 (H) 08/19/2020   TRIG 129 08/19/2020   Attestation Statements:   Reviewed by clinician on day of visit: allergies, medications, problem list, medical history, surgical history, family history, social history, and previous encounter notes.  I, Insurance claims handlerAmber Agner, CMA, am acting as Energy managertranscriptionist for Marsh & McLennanDeborah Damaria Stofko, DO.  I have reviewed the above documentation for accuracy and completeness, and I agree with the above. Thomasene Lot-  Aribelle Mccosh, DO

## 2020-08-24 ENCOUNTER — Other Ambulatory Visit: Payer: Self-pay

## 2020-08-24 ENCOUNTER — Encounter (INDEPENDENT_AMBULATORY_CARE_PROVIDER_SITE_OTHER): Payer: Self-pay

## 2020-08-24 ENCOUNTER — Telehealth (INDEPENDENT_AMBULATORY_CARE_PROVIDER_SITE_OTHER): Payer: BC Managed Care – PPO | Admitting: Psychology

## 2020-09-02 ENCOUNTER — Other Ambulatory Visit: Payer: Self-pay

## 2020-09-02 ENCOUNTER — Ambulatory Visit (INDEPENDENT_AMBULATORY_CARE_PROVIDER_SITE_OTHER): Payer: BC Managed Care – PPO | Admitting: Family Medicine

## 2020-09-02 ENCOUNTER — Encounter (INDEPENDENT_AMBULATORY_CARE_PROVIDER_SITE_OTHER): Payer: Self-pay | Admitting: Family Medicine

## 2020-09-02 VITALS — BP 137/77 | HR 77 | Temp 98.6°F | Ht 67.0 in | Wt 301.0 lb

## 2020-09-02 DIAGNOSIS — E8881 Metabolic syndrome: Secondary | ICD-10-CM

## 2020-09-02 DIAGNOSIS — Z6841 Body Mass Index (BMI) 40.0 and over, adult: Secondary | ICD-10-CM

## 2020-09-02 DIAGNOSIS — F4320 Adjustment disorder, unspecified: Secondary | ICD-10-CM

## 2020-09-02 DIAGNOSIS — Z9189 Other specified personal risk factors, not elsewhere classified: Secondary | ICD-10-CM

## 2020-09-02 DIAGNOSIS — E7849 Other hyperlipidemia: Secondary | ICD-10-CM | POA: Diagnosis not present

## 2020-09-02 DIAGNOSIS — R7989 Other specified abnormal findings of blood chemistry: Secondary | ICD-10-CM | POA: Diagnosis not present

## 2020-09-02 DIAGNOSIS — E559 Vitamin D deficiency, unspecified: Secondary | ICD-10-CM | POA: Diagnosis not present

## 2020-09-02 MED ORDER — PAROXETINE HCL 20 MG PO TABS: ORAL_TABLET | ORAL | Status: AC

## 2020-09-02 MED ORDER — PAROXETINE HCL 10 MG PO TABS: ORAL_TABLET | ORAL | 0 refills | Status: AC

## 2020-09-02 MED ORDER — VITAMIN D (ERGOCALCIFEROL) 1.25 MG (50000 UNIT) PO CAPS: 50000.0000 [IU] | ORAL_CAPSULE | ORAL | 0 refills | Status: DC

## 2020-09-02 NOTE — Patient Instructions (Signed)
The 10-year ASCVD risk score Denman George DC Montez Hageman., et al., 2013) is: 1.3%   Values used to calculate the score:     Age: 49 years     Sex: Female     Is Non-Hispanic African American: No     Diabetic: No     Tobacco smoker: No     Systolic Blood Pressure: 137 mmHg     Is BP treated: No     HDL Cholesterol: 44 mg/dL     Total Cholesterol: 179 mg/dL

## 2020-09-07 NOTE — Progress Notes (Signed)
Chief Complaint:   OBESITY Valerie Johnston is here to discuss her progress with her obesity treatment plan along with follow-up of her obesity related diagnoses. Valerie Johnston is on the Category 3 Plan and states she is following her eating plan approximately 30% or less of the time. Valerie Johnston states she is exercising for 0 minutes 0 times per week.  Today's visit was #: 2 Starting weight: 298 lbs Starting date: 08/19/2020 Today's weight: 301 lbs Today's date: 09/02/2020 Total lbs lost to date: 0 Total lbs lost since last in-office visit: 0  ( Up 3 lbs )  Interim History: Valerie Johnston says she is eating all her morning foods.  She says it was difficult to get in lunch and dinner.  She did not meal prep and then she ate "whatever was easiest".    Subjective:   1. Other hyperlipidemia Valerie Johnston has hyperlipidemia and has been trying to improve her cholesterol levels with intensive lifestyle modification including a low saturated fat diet, exercise and weight loss. She denies any chest pain, claudication or myalgias.  Elevated LDL of 112, HDL 44.  ASCVD 1.3%.  The 10-year ASCVD risk score Valerie George DC Jr., et al., 2013) is: 1.3%   Lab Results  Component Value Date   ALT 15 08/19/2020   AST 16 08/19/2020   ALKPHOS 105 08/19/2020   BILITOT 0.3 08/19/2020   Lab Results  Component Value Date   CHOL 179 08/19/2020   HDL 44 08/19/2020   LDLCALC 112 (H) 08/19/2020   TRIG 129 08/19/2020   2. Insulin resistance Valerie Johnston has a diagnosis of insulin resistance based on her elevated fasting insulin level >5. She continues to work on diet and exercise to decrease her risk of diabetes.    Lab Results  Component Value Date   INSULIN 10.5 08/19/2020   Lab Results  Component Value Date   HGBA1C 5.6 08/19/2020   3. Vitamin D deficiency Valerie Johnston's Vitamin D level was 26.5 on 08/19/2020. She is currently taking no vitamin D supplement. She denies nausea, vomiting or muscle weakness.  She endorses fatigue and occasional  achiness in her muscles.  4. TSH elevation T3 and T4 are normal.  She says she has more acute stress lately.   Lab Results  Component Value Date   TSH 5.510 (H) 08/19/2020   5. Adjustment disorder, with emotional eating She tried to wean herself off Paxil by taking 1 tablet every other day and was too "short and snappy".  She desires to try to wean off.  Denies concerns/symptoms.  6. At risk for hyperglycemia - Valerie Johnston was given extensive diabetes prevention education and counseling today  Assessment/Plan:   1. Other hyperlipidemia New.  Discussed labs with patient today.  Cardiovascular risk and specific lipid/LDL goals reviewed.  We discussed several lifestyle modifications today and [name] will continue to work on diet, exercise and weight loss efforts. Orders and follow up as documented in patient record.    Counseling Intensive lifestyle modifications are the first line treatment for this issue. . Dietary changes: Increase soluble fiber. Decrease simple carbohydrates. . Exercise changes: Moderate to vigorous-intensity aerobic activity 150 minutes per week if tolerated. . Lipid-lowering medications: see documented in medical record.   2. Insulin resistance New.  Discussed labs with patient today.  Valerie Johnston will continue to work on weight loss, exercise, and decreasing simple carbohydrates to help decrease the risk of diabetes. Valerie Johnston agreed to follow-up with [place] as directed to closely monitor her progress.  Discussed this diagnosis with  her and pathophysiology/effects of insulin on hunger, etc.  Decrease simple carbs and follow meal plan to lose weight!   3. Vitamin D deficiency New.  Discussed labs with patient today.  Low Vitamin D level contributes to fatigue and are associated with obesity, breast, and colon cancer. She agrees to start to take prescription Vitamin D @50 ,000 IU every week and will follow-up for routine testing of Vitamin D, at least 2-3 times per year to avoid  over-replacement.  Recheck level in 2-3 months.  -Start Vitamin D, Ergocalciferol, (DRISDOL) 1.25 MG (50000 UNIT) CAPS capsule; Take 1 capsule (50,000 Units total) by mouth every 7 (seven) days.  Dispense: 4 capsule; Refill: 0   4. TSH elevation New.  Discussed labs with patient today.  Will monitor and recheck labs in 2-3 months.  Discussed with her "subclinical hypothyroidism" and meaning of lab results.  Close follow-up recommended.   5. Adjustment disorder, with emotional eating Discussed labs with patient today.  Pt desires to try to ween off paxil today. --> Weening schedule:     Start Paxil 20 mg every other day with 10 mg every other day.    - New script sent in for 10 mg tablets.  We will wean dose down as slowly as tolerated.   6. At risk for hyperglycemia - Valerie Johnston was given extensive diabetes prevention education and counseling today of more than 23.5 minutes.  - Counseled patient on pathophysiology of disease and discussed various treatment options which always includes dietary and lifestyle modification as first line.   - Importance of healthy diet with very limited amounts of simple carbohydrates discussed with patient in addition to regular aerobic exercise of Valerie Johnston 5d/week or more.  - Handouts provided at patient's desire and or told to go online at the American Diabetes Association website for further information   7. Class 3 severe obesity with serious comorbidity and body mass index (BMI) of 45.0 to 49.9 in adult, unspecified obesity type (HCC) Valerie Johnston is currently in the action stage of change. As such, her goal is to continue with weight loss efforts. She has agreed to the Category 3 Plan.   Exercise goals: As is.  Behavioral modification strategies: increasing lean protein intake, decreasing simple carbohydrates, increasing water intake, meal planning and cooking strategies, keeping healthy foods in the home and planning for success.  Valerie Johnston has agreed to follow-up  with our clinic in 2 weeks. She was informed of the importance of frequent follow-up visits to maximize her success with intensive lifestyle modifications for her multiple health conditions.   Objective:   Blood pressure 137/77, pulse 77, temperature 98.6 F (37 C), height 5\' 7"  (1.702 m), weight (!) 301 lb (136.5 kg), SpO2 95 %. Body mass index is 47.14 kg/m.  General: Cooperative, alert, well developed, in no acute distress. HEENT: Conjunctivae and lids unremarkable. Cardiovascular: Regular rhythm.  Lungs: Normal work of breathing. Neurologic: No focal deficits.   Lab Results  Component Value Date   CREATININE 0.76 08/19/2020   BUN 9 08/19/2020   NA 139 08/19/2020   K 4.3 08/19/2020   CL 103 08/19/2020   CO2 22 08/19/2020   Lab Results  Component Value Date   ALT 15 08/19/2020   AST 16 08/19/2020   ALKPHOS 105 08/19/2020   BILITOT 0.3 08/19/2020   Lab Results  Component Value Date   HGBA1C 5.6 08/19/2020   Lab Results  Component Value Date   INSULIN 10.5 08/19/2020   Lab Results  Component Value  Date   TSH 5.510 (H) 08/19/2020   Lab Results  Component Value Date   CHOL 179 08/19/2020   HDL 44 08/19/2020   LDLCALC 112 (H) 08/19/2020   TRIG 129 08/19/2020   Attestation Statements:   Reviewed by clinician on day of visit: allergies, medications, problem list, medical history, surgical history, family history, social history, and previous encounter notes.  I, Insurance claims handler, CMA, am acting as Energy manager for [employer], DO.  I have reviewed the above documentation for accuracy and completeness, and I agree with the above. Thomasene Lot, DO

## 2020-09-20 ENCOUNTER — Ambulatory Visit (INDEPENDENT_AMBULATORY_CARE_PROVIDER_SITE_OTHER): Payer: BC Managed Care – PPO | Admitting: Family Medicine

## 2020-09-22 ENCOUNTER — Other Ambulatory Visit (INDEPENDENT_AMBULATORY_CARE_PROVIDER_SITE_OTHER): Payer: Self-pay | Admitting: Family Medicine

## 2020-09-22 DIAGNOSIS — E559 Vitamin D deficiency, unspecified: Secondary | ICD-10-CM

## 2020-09-24 ENCOUNTER — Other Ambulatory Visit (INDEPENDENT_AMBULATORY_CARE_PROVIDER_SITE_OTHER): Payer: Self-pay | Admitting: Family Medicine

## 2020-09-24 DIAGNOSIS — F4320 Adjustment disorder, unspecified: Secondary | ICD-10-CM

## 2022-07-19 ENCOUNTER — Encounter (INDEPENDENT_AMBULATORY_CARE_PROVIDER_SITE_OTHER): Payer: Self-pay

## 2022-12-18 ENCOUNTER — Encounter (INDEPENDENT_AMBULATORY_CARE_PROVIDER_SITE_OTHER): Payer: Self-pay | Admitting: Family Medicine

## 2022-12-18 ENCOUNTER — Ambulatory Visit (INDEPENDENT_AMBULATORY_CARE_PROVIDER_SITE_OTHER): Payer: BC Managed Care – PPO | Admitting: Family Medicine

## 2022-12-18 VITALS — BP 128/81 | HR 68 | Temp 98.7°F | Ht 67.0 in | Wt 318.4 lb

## 2022-12-18 DIAGNOSIS — E559 Vitamin D deficiency, unspecified: Secondary | ICD-10-CM | POA: Insufficient documentation

## 2022-12-18 DIAGNOSIS — Z6841 Body Mass Index (BMI) 40.0 and over, adult: Secondary | ICD-10-CM | POA: Diagnosis not present

## 2022-12-18 DIAGNOSIS — F4329 Adjustment disorder with other symptoms: Secondary | ICD-10-CM

## 2022-12-18 DIAGNOSIS — E669 Obesity, unspecified: Secondary | ICD-10-CM

## 2022-12-18 DIAGNOSIS — E66813 Obesity, class 3: Secondary | ICD-10-CM | POA: Insufficient documentation

## 2022-12-18 DIAGNOSIS — F4320 Adjustment disorder, unspecified: Secondary | ICD-10-CM | POA: Insufficient documentation
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
## 2023-01-23 ENCOUNTER — Ambulatory Visit (INDEPENDENT_AMBULATORY_CARE_PROVIDER_SITE_OTHER): Payer: BC Managed Care – PPO | Admitting: Family Medicine

## 2023-02-06 ENCOUNTER — Ambulatory Visit (INDEPENDENT_AMBULATORY_CARE_PROVIDER_SITE_OTHER): Payer: BC Managed Care – PPO | Admitting: Family Medicine
